# Patient Record
Sex: Male | Born: 1990 | Race: White | Hispanic: No | Marital: Single | Smoking: Never smoker
Health system: Southern US, Community
[De-identification: ages and names within clinical notes are randomized; demographics above are authoritative.]

## PROBLEM LIST (undated history)

## (undated) HISTORY — PX: OTHER SURGICAL HISTORY: SHX169

---
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
## 2008-01-23 ENCOUNTER — Emergency Department: Payer: Self-pay | Admitting: Emergency Medicine

## 2014-01-14 ENCOUNTER — Emergency Department: Payer: Self-pay | Admitting: Emergency Medicine

## 2014-01-22 ENCOUNTER — Emergency Department: Payer: Self-pay | Admitting: Emergency Medicine

## 2016-11-20 ENCOUNTER — Encounter (INDEPENDENT_AMBULATORY_CARE_PROVIDER_SITE_OTHER): Payer: BLUE CROSS/BLUE SHIELD | Admitting: Podiatry

## 2016-11-20 ENCOUNTER — Other Ambulatory Visit: Payer: Self-pay | Admitting: *Deleted

## 2016-11-20 NOTE — Progress Notes (Signed)
This encounter was created in error - please disregard.

## 2019-08-24 ENCOUNTER — Other Ambulatory Visit: Payer: Self-pay

## 2019-08-24 DIAGNOSIS — Z20822 Contact with and (suspected) exposure to covid-19: Secondary | ICD-10-CM

## 2019-08-26 LAB — NOVEL CORONAVIRUS, NAA: SARS-CoV-2, NAA: NOT DETECTED

## 2020-09-30 ENCOUNTER — Emergency Department (HOSPITAL_COMMUNITY)
Admission: EM | Admit: 2020-09-30 | Discharge: 2020-09-30 | Disposition: A | Discharge status: Home / Self Care | Payer: Self-pay | Attending: Emergency Medicine | Admitting: Emergency Medicine

## 2020-09-30 ENCOUNTER — Other Ambulatory Visit: Payer: Self-pay

## 2020-09-30 ENCOUNTER — Encounter (HOSPITAL_COMMUNITY): Payer: Self-pay | Admitting: *Deleted

## 2020-09-30 ENCOUNTER — Emergency Department (HOSPITAL_COMMUNITY): Payer: Self-pay

## 2020-09-30 DIAGNOSIS — S61012A Laceration without foreign body of left thumb without damage to nail, initial encounter: Secondary | ICD-10-CM | POA: Insufficient documentation

## 2020-09-30 DIAGNOSIS — S52601A Unspecified fracture of lower end of right ulna, initial encounter for closed fracture: Secondary | ICD-10-CM

## 2020-09-30 DIAGNOSIS — S52251A Displaced comminuted fracture of shaft of ulna, right arm, initial encounter for closed fracture: Secondary | ICD-10-CM | POA: Insufficient documentation

## 2020-09-30 DIAGNOSIS — S61211A Laceration without foreign body of left index finger without damage to nail, initial encounter: Secondary | ICD-10-CM | POA: Insufficient documentation

## 2020-09-30 DIAGNOSIS — T07XXXA Unspecified multiple injuries, initial encounter: Secondary | ICD-10-CM

## 2020-09-30 DIAGNOSIS — S1191XA Laceration without foreign body of unspecified part of neck, initial encounter: Secondary | ICD-10-CM | POA: Insufficient documentation

## 2020-09-30 DIAGNOSIS — S61215A Laceration without foreign body of left ring finger without damage to nail, initial encounter: Secondary | ICD-10-CM | POA: Insufficient documentation

## 2020-09-30 DIAGNOSIS — S51812A Laceration without foreign body of left forearm, initial encounter: Secondary | ICD-10-CM | POA: Insufficient documentation

## 2020-09-30 LAB — I-STAT CHEM 8, ED
BUN: 16 mg/dL (ref 6–20)
Calcium, Ion: 1.06 mmol/L — ABNORMAL LOW (ref 1.15–1.40)
Chloride: 107 mmol/L (ref 98–111)
Creatinine, Ser: 1.3 mg/dL — ABNORMAL HIGH (ref 0.61–1.24)
Glucose, Bld: 129 mg/dL — ABNORMAL HIGH (ref 70–99)
HCT: 44 % (ref 39.0–52.0)
Hemoglobin: 15 g/dL (ref 13.0–17.0)
Potassium: 3.6 mmol/L (ref 3.5–5.1)
Sodium: 141 mmol/L (ref 135–145)
TCO2: 21 mmol/L — ABNORMAL LOW (ref 22–32)

## 2020-09-30 MED ORDER — SODIUM CHLORIDE 0.9 % IV BOLUS (SEPSIS)
1000.0000 mL | Freq: Once | INTRAVENOUS | Status: AC
  Administered 2020-09-30: 10:00:00 1000 mL via INTRAVENOUS

## 2020-09-30 MED ORDER — LIDOCAINE-EPINEPHRINE 1 %-1:100000 IJ SOLN
20.0000 mL | Freq: Once | INTRAMUSCULAR | Status: AC
  Administered 2020-09-30: 08:00:00 20 mL via INTRADERMAL
  Filled 2020-09-30: qty 1

## 2020-09-30 MED ORDER — SODIUM CHLORIDE 0.9 % IV BOLUS (SEPSIS)
1000.0000 mL | Freq: Once | INTRAVENOUS | Status: AC
  Administered 2020-09-30: 08:00:00 1000 mL via INTRAVENOUS
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
## 2020-09-30 NOTE — ED Notes (Signed)
Family at bedside. 
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
## 2020-09-30 NOTE — ED Notes (Signed)
All wounds cleaned on left arm, hand and left lateral neck. Wound care completed. Ortho to apply splint.

## 2020-09-30 NOTE — Discharge Instructions (Signed)
You may alternate Tylenol 1000 mg every 6 hours as needed for pain, fever and Ibuprofen 800 mg every 8 hours as needed for pain, fever.  Please take Ibuprofen with food.  Do not take more than 4000 mg of Tylenol (acetaminophen) in a 24 hour period.  You may clean your wounds gently with warm soap and water and apply over-the-counter Neosporin 1-2 times a day until healed.  Your sutures will need to be removed in 7 to 10 days.  Please follow-up closely with hand surgery for your right upper extremity fracture.  You need to keep your splint on at all times and keep it clean and dry.  I recommend keeping your arm elevated above the level of your heart when at rest to help with pain and swelling.

## 2020-09-30 NOTE — Progress Notes (Signed)
Orthopedic Tech Progress Note Patient Details:  CHRYSTIAN CUPPLES 01-09-1991 [id number]  Ortho Devices Type of Ortho Device: Sling immobilizer,Sugartong splint Ortho Device/Splint Location: Right Upper Extremity Ortho Device/Splint Interventions: Ordered,Application   Post Interventions Patient Tolerated: Well Instructions Provided: Adjustment of device,Poper ambulation with device,Care of device   Dacotah Cabello P Harle Stanford 09/30/2020, 10:28 AM

## 2020-10-01 ENCOUNTER — Other Ambulatory Visit: Payer: Self-pay

## 2020-10-01 ENCOUNTER — Encounter (HOSPITAL_BASED_OUTPATIENT_CLINIC_OR_DEPARTMENT_OTHER): Payer: Self-pay | Admitting: Orthopedic Surgery

## 2020-10-08 ENCOUNTER — Other Ambulatory Visit (HOSPITAL_COMMUNITY)
Admission: RE | Admit: 2020-10-08 | Discharge: 2020-10-08 | Disposition: A | Discharge status: Home / Self Care | Payer: Self-pay | Source: Ambulatory Visit | Attending: Orthopedic Surgery | Admitting: Orthopedic Surgery

## 2020-10-08 DIAGNOSIS — Z01812 Encounter for preprocedural laboratory examination: Secondary | ICD-10-CM | POA: Insufficient documentation

## 2020-10-08 DIAGNOSIS — Z20822 Contact with and (suspected) exposure to covid-19: Secondary | ICD-10-CM | POA: Insufficient documentation

## 2020-10-08 DIAGNOSIS — U071 COVID-19: Secondary | ICD-10-CM | POA: Insufficient documentation

## 2020-10-08 LAB — SARS CORONAVIRUS 2 (TAT 6-24 HRS): SARS Coronavirus 2: POSITIVE — AB

## 2020-10-08 NOTE — Anesthesia Preprocedure Evaluation (Addendum)
Anesthesia Evaluation  Patient identified by MRN, date of birth, ID band Patient awake    Reviewed: Allergy & Precautions, H&P , NPO status , Patient's Chart, lab work & pertinent test results  Airway Mallampati: II  TM Distance: >3 FB Neck ROM: Full    Dental no notable dental hx. (+) Teeth Intact, Dental Advisory Given   Pulmonary neg pulmonary ROS,    Pulmonary exam normal breath sounds clear to auscultation       Cardiovascular Exercise Tolerance: Good negative cardio ROS   Rhythm:Regular Rate:Normal     Neuro/Psych negative neurological ROS  negative psych ROS   GI/Hepatic negative GI ROS, Neg liver ROS,   Endo/Other  negative endocrine ROS  Renal/GU negative Renal ROS  negative genitourinary   Musculoskeletal   Abdominal   Peds  Hematology negative hematology ROS (+)   Anesthesia Other Findings   Reproductive/Obstetrics negative OB ROS                           Anesthesia Physical Anesthesia Plan  ASA: II  Anesthesia Plan: Regional and MAC   Post-op Pain Management:    Induction: Intravenous  PONV Risk Score and Plan: 2 and Ondansetron, Midazolam and Propofol infusion  Airway Management Planned: Nasal Cannula and Natural Airway  Additional Equipment:   Intra-op Plan:   Post-operative Plan:   Informed Consent: I have reviewed the patients History and Physical, chart, labs and discussed the procedure including the risks, benefits and alternatives for the proposed anesthesia with the patient or authorized representative who has indicated his/her understanding and acceptance.     Dental advisory given  Plan Discussed with: CRNA and Anesthesiologist  Anesthesia Plan Comments: (R supraclavicular block)       Anesthesia Quick Evaluation

## 2020-10-08 NOTE — Progress Notes (Signed)
Pt went for covid test this am and understands to go home and quarantine until arrival here tomorrow at 11:15am for surgery.Instructions reviewed, pt verbalized understanding.

## 2020-10-09 ENCOUNTER — Encounter (HOSPITAL_COMMUNITY)
Admission: RE | Disposition: A | Discharge status: Home / Self Care | Payer: Self-pay | Source: Home / Self Care | Attending: Orthopedic Surgery

## 2020-10-09 ENCOUNTER — Other Ambulatory Visit: Payer: Self-pay

## 2020-10-09 ENCOUNTER — Ambulatory Visit (HOSPITAL_COMMUNITY)
Admission: RE | Admit: 2020-10-09 | Discharge: 2020-10-09 | Disposition: A | Discharge status: Home / Self Care | Payer: Self-pay | Attending: Orthopedic Surgery | Admitting: Orthopedic Surgery

## 2020-10-09 ENCOUNTER — Ambulatory Visit (HOSPITAL_COMMUNITY): Payer: Self-pay

## 2020-10-09 ENCOUNTER — Encounter (HOSPITAL_COMMUNITY): Payer: Self-pay | Admitting: Orthopedic Surgery

## 2020-10-09 ENCOUNTER — Ambulatory Visit (HOSPITAL_COMMUNITY): Payer: Self-pay | Admitting: Anesthesiology

## 2020-10-09 DIAGNOSIS — Y939 Activity, unspecified: Secondary | ICD-10-CM | POA: Insufficient documentation

## 2020-10-09 DIAGNOSIS — S1191XS Laceration without foreign body of unspecified part of neck, sequela: Secondary | ICD-10-CM | POA: Insufficient documentation

## 2020-10-09 DIAGNOSIS — U071 COVID-19: Secondary | ICD-10-CM | POA: Insufficient documentation

## 2020-10-09 DIAGNOSIS — S41112S Laceration without foreign body of left upper arm, sequela: Secondary | ICD-10-CM | POA: Insufficient documentation

## 2020-10-09 DIAGNOSIS — S52601A Unspecified fracture of lower end of right ulna, initial encounter for closed fracture: Secondary | ICD-10-CM | POA: Insufficient documentation

## 2020-10-09 HISTORY — PX: ORIF ULNAR FRACTURE: SHX5417

## 2020-10-09 SURGERY — OPEN REDUCTION INTERNAL FIXATION (ORIF) ULNAR FRACTURE
Anesthesia: Monitor Anesthesia Care | Laterality: Right

## 2020-10-09 MED ORDER — PROPOFOL 1000 MG/100ML IV EMUL
INTRAVENOUS | Status: AC
  Filled 2020-10-09: qty 100

## 2020-10-09 MED ORDER — FENTANYL CITRATE (PF) 100 MCG/2ML IJ SOLN
INTRAMUSCULAR | Status: AC
  Administered 2020-10-09: 13:00:00 100 ug via INTRAVENOUS
  Filled 2020-10-09: qty 2

## 2020-10-09 MED ORDER — LIDOCAINE HCL (CARDIAC) PF 100 MG/5ML IV SOSY
PREFILLED_SYRINGE | INTRAVENOUS | Status: DC | PRN
  Administered 2020-10-09: 25 mg via INTRAVENOUS

## 2020-10-09 MED ORDER — ACETAMINOPHEN 500 MG PO TABS
1000.0000 mg | ORAL_TABLET | Freq: Once | ORAL | Status: AC
  Administered 2020-10-09: 12:00:00 1000 mg via ORAL
  Filled 2020-10-09: qty 2

## 2020-10-09 MED ORDER — FENTANYL CITRATE (PF) 100 MCG/2ML IJ SOLN
INTRAMUSCULAR | Status: DC | PRN
  Administered 2020-10-09: 50 ug via INTRAVENOUS

## 2020-10-09 MED ORDER — LACTATED RINGERS IV SOLN: INTRAVENOUS | Status: DC

## 2020-10-09 MED ORDER — PROPOFOL 500 MG/50ML IV EMUL
INTRAVENOUS | Status: DC | PRN
  Administered 2020-10-09: 75 ug/kg/min via INTRAVENOUS

## 2020-10-09 MED ORDER — MIDAZOLAM HCL 2 MG/2ML IJ SOLN
INTRAMUSCULAR | Status: AC
  Administered 2020-10-09: 13:00:00 2 mg via INTRAVENOUS
  Filled 2020-10-09: qty 2

## 2020-10-09 MED ORDER — 0.9 % SODIUM CHLORIDE (POUR BTL) OPTIME
TOPICAL | Status: DC | PRN
Start: 2020-10-09 — End: 2020-10-09
  Administered 2020-10-09: 16:00:00 1000 mL

## 2020-10-09 MED ORDER — CHLORHEXIDINE GLUCONATE 0.12 % MT SOLN
15.0000 mL | Freq: Once | OROMUCOSAL | Status: AC
  Administered 2020-10-09: 12:00:00 15 mL via OROMUCOSAL
  Filled 2020-10-09: qty 15

## 2020-10-09 MED ORDER — HYDROCODONE-ACETAMINOPHEN 5-325 MG PO TABS: 1.0000 | ORAL_TABLET | Freq: Four times a day (QID) | ORAL | 0 refills | Status: AC | PRN

## 2020-10-09 MED ORDER — MIDAZOLAM HCL 2 MG/2ML IJ SOLN
INTRAMUSCULAR | Status: DC | PRN
  Administered 2020-10-09: 2 mg via INTRAVENOUS

## 2020-10-09 MED ORDER — FENTANYL CITRATE (PF) 250 MCG/5ML IJ SOLN
INTRAMUSCULAR | Status: AC
  Filled 2020-10-09: qty 5

## 2020-10-09 MED ORDER — HYDROMORPHONE HCL 1 MG/ML IJ SOLN: 0.2500 mg | INTRAMUSCULAR | Status: DC | PRN

## 2020-10-09 MED ORDER — DEXMEDETOMIDINE HCL 200 MCG/2ML IV SOLN
INTRAVENOUS | Status: DC | PRN
  Administered 2020-10-09: 4 ug via INTRAVENOUS
  Administered 2020-10-09: 8 ug via INTRAVENOUS

## 2020-10-09 MED ORDER — CEFAZOLIN SODIUM-DEXTROSE 2-4 GM/100ML-% IV SOLN
2.0000 g | INTRAVENOUS | Status: AC
  Administered 2020-10-09: 15:00:00 2 g via INTRAVENOUS
  Filled 2020-10-09: qty 100

## 2020-10-09 MED ORDER — FENTANYL CITRATE (PF) 100 MCG/2ML IJ SOLN: 100.0000 ug | Freq: Once | INTRAMUSCULAR | Status: AC

## 2020-10-09 MED ORDER — ORAL CARE MOUTH RINSE: 15.0000 mL | Freq: Once | OROMUCOSAL | Status: AC

## 2020-10-09 MED ORDER — MIDAZOLAM HCL 2 MG/2ML IJ SOLN: 2.0000 mg | Freq: Once | INTRAMUSCULAR | Status: AC

## 2020-10-09 MED ORDER — DIPHENHYDRAMINE HCL 50 MG/ML IJ SOLN
INTRAMUSCULAR | Status: DC | PRN
  Administered 2020-10-09: 12.5 mg via INTRAVENOUS

## 2020-10-09 MED ORDER — MIDAZOLAM HCL 2 MG/2ML IJ SOLN
INTRAMUSCULAR | Status: AC
  Filled 2020-10-09: qty 2

## 2020-10-09 MED ORDER — BUPIVACAINE-EPINEPHRINE (PF) 0.5% -1:200000 IJ SOLN
INTRAMUSCULAR | Status: DC | PRN
  Administered 2020-10-09: 30 mL via PERINEURAL

## 2020-10-09 SURGICAL SUPPLY — 67 items
BENZOIN TINCTURE PRP APPL 2/3 (GAUZE/BANDAGES/DRESSINGS) ×2 IMPLANT
BIT DRILL 2.8X5 QR DISP (BIT) ×2 IMPLANT
BLADE CLIPPER SURG (BLADE) IMPLANT
BNDG ELASTIC 3X5.8 VLCR STR LF (GAUZE/BANDAGES/DRESSINGS) ×2 IMPLANT
BNDG ELASTIC 4X5.8 VLCR STR LF (GAUZE/BANDAGES/DRESSINGS) ×4 IMPLANT
BNDG ESMARK 4X9 LF (GAUZE/BANDAGES/DRESSINGS) ×2 IMPLANT
CLOSURE STERI-STRIP 1/4X4 (GAUZE/BANDAGES/DRESSINGS) ×2 IMPLANT
CLSR STERI-STRIP ANTIMIC 1/2X4 (GAUZE/BANDAGES/DRESSINGS) ×2 IMPLANT
CORD BIPOLAR FORCEPS 12FT (ELECTRODE) ×2 IMPLANT
COVER SURGICAL LIGHT HANDLE (MISCELLANEOUS) ×4 IMPLANT
COVER WAND RF STERILE (DRAPES) ×2 IMPLANT
CUFF TOURN SGL QUICK 18X4 (TOURNIQUET CUFF) ×2 IMPLANT
CUFF TOURN SGL QUICK 24 (TOURNIQUET CUFF)
CUFF TRNQT CYL 24X4X16.5-23 (TOURNIQUET CUFF) IMPLANT
DECANTER SPIKE VIAL GLASS SM (MISCELLANEOUS) IMPLANT
DRAPE INCISE IOBAN 66X45 STRL (DRAPES) IMPLANT
DRAPE OEC MINIVIEW 54X84 (DRAPES) IMPLANT
DRAPE U-SHAPE 47X51 STRL (DRAPES) ×2 IMPLANT
DRSG PAD ABDOMINAL 8X10 ST (GAUZE/BANDAGES/DRESSINGS) ×2 IMPLANT
ELECT REM PT RETURN 9FT ADLT (ELECTROSURGICAL)
ELECTRODE REM PT RTRN 9FT ADLT (ELECTROSURGICAL) IMPLANT
GAUZE SPONGE 4X4 12PLY STRL (GAUZE/BANDAGES/DRESSINGS) ×2 IMPLANT
GAUZE SPONGE 4X4 12PLY STRL LF (GAUZE/BANDAGES/DRESSINGS) ×2 IMPLANT
GAUZE XEROFORM 1X8 LF (GAUZE/BANDAGES/DRESSINGS) ×2 IMPLANT
GLOVE BIO SURGEON STRL SZ7 (GLOVE) ×2 IMPLANT
GLOVE BIOGEL PI IND STRL 7.0 (GLOVE) ×1 IMPLANT
GLOVE BIOGEL PI INDICATOR 7.0 (GLOVE) ×1
GLOVE ORTHO TXT STRL SZ7.5 (GLOVE) ×2 IMPLANT
GLOVE SS BIOGEL STRL SZ 8 (GLOVE) ×1 IMPLANT
GLOVE SUPERSENSE BIOGEL SZ 8 (GLOVE) ×1
GLOVE SURG ORTHO 8.0 STRL STRW (GLOVE) ×4 IMPLANT
GOWN STRL REUS W/ TWL LRG LVL3 (GOWN DISPOSABLE) IMPLANT
GOWN STRL REUS W/TWL LRG LVL3 (GOWN DISPOSABLE)
GUIDEWIRE ORTHO .059X5 (WIRE) ×2 IMPLANT
KIT BASIN OR (CUSTOM PROCEDURE TRAY) ×2 IMPLANT
KIT TURNOVER KIT B (KITS) ×2 IMPLANT
LOOP VESSEL MAXI BLUE (MISCELLANEOUS) ×2 IMPLANT
MANIFOLD NEPTUNE II (INSTRUMENTS) ×2 IMPLANT
NEEDLE 22X1 1/2 (OR ONLY) (NEEDLE) IMPLANT
NEEDLE BLUNT 16X1.5 OR ONLY (NEEDLE) IMPLANT
NS IRRIG 1000ML POUR BTL (IV SOLUTION) ×4 IMPLANT
PACK ORTHO EXTREMITY (CUSTOM PROCEDURE TRAY) ×2 IMPLANT
PAD ABD 8X10 STRL (GAUZE/BANDAGES/DRESSINGS) ×2 IMPLANT
PAD ARMBOARD 7.5X6 YLW CONV (MISCELLANEOUS) ×4 IMPLANT
PAD CAST 4YDX4 CTTN HI CHSV (CAST SUPPLIES) ×1 IMPLANT
PADDING CAST COTTON 4X4 STRL (CAST SUPPLIES) ×1
PLATE ULNA MIDSHAFT 8 HOLE (Plate) ×2 IMPLANT
SCREW CORTICAL 3.5X20MM (Screw) ×4 IMPLANT
SCREW HEXALOBE NON-LOCK 3.5X14 (Screw) ×6 IMPLANT
SCREW HEXALOBE NON-LOCK 3.5X16 (Screw) ×2 IMPLANT
SCREW NON LOCKING HEX 3.5X18MM (Screw) ×2 IMPLANT
SLING ARM FOAM STRAP LRG (SOFTGOODS) ×2 IMPLANT
SPONGE LAP 4X18 RFD (DISPOSABLE) ×2 IMPLANT
STAPLER VISISTAT 35W (STAPLE) IMPLANT
SUCTION FRAZIER HANDLE 10FR (MISCELLANEOUS) ×1
SUCTION TUBE FRAZIER 10FR DISP (MISCELLANEOUS) ×1 IMPLANT
SUT ETHILON 4 0 PS 2 18 (SUTURE) IMPLANT
SUT PROLENE 4 0 PS 2 18 (SUTURE) IMPLANT
SUT VIC AB 2-0 CT1 27 (SUTURE) ×1
SUT VIC AB 2-0 CT1 TAPERPNT 27 (SUTURE) ×1 IMPLANT
SUT VIC AB 3-0 FS2 27 (SUTURE) ×2 IMPLANT
SYR CONTROL 10ML LL (SYRINGE) IMPLANT
TOWEL GREEN STERILE (TOWEL DISPOSABLE) ×2 IMPLANT
TOWEL GREEN STERILE FF (TOWEL DISPOSABLE) ×2 IMPLANT
TUBE CONNECTING 12X1/4 (SUCTIONS) ×2 IMPLANT
WATER STERILE IRR 1000ML POUR (IV SOLUTION) ×4 IMPLANT
YANKAUER SUCT BULB TIP NO VENT (SUCTIONS) IMPLANT

## 2020-10-09 NOTE — Progress Notes (Signed)
Patient with positive covid result. Contacted MD and informed of result.   

## 2020-10-09 NOTE — Anesthesia Postprocedure Evaluation (Signed)
Anesthesia Post Note  Patient: Eric Stanley  Procedure(s) Performed: OPEN REDUCTION INTERNAL FIXATION (ORIF) ULNAR FRACTURE (Right )     Patient location during evaluation: PACU Anesthesia Type: Regional and MAC Level of consciousness: awake and alert Pain management: pain level controlled Vital Signs Assessment: post-procedure vital signs reviewed and stable Respiratory status: spontaneous breathing, nonlabored ventilation and respiratory function stable Cardiovascular status: stable and blood pressure returned to baseline Postop Assessment: no apparent nausea or vomiting Anesthetic complications: no   No complications documented.  Last Vitals:  Vitals:   10/09/20 1700 10/09/20 1710  BP: 128/67 (!) 145/78  Pulse: (!) 57 (!) 56  Resp: 14   Temp: (!) 36.1 C   SpO2: 97% 98%    Last Pain:  Vitals:   10/09/20 1700  TempSrc:   PainSc: 0-No pain                 Phillippa Straub,W. EDMOND

## 2020-10-09 NOTE — OR Nursing (Signed)
SUTURES REMOVED FROM MULTIPLE SMALL HEALED WOUNDS ON LEFT FOREARM AND NECK AT END OF PROCEDURE BY PA.

## 2020-10-09 NOTE — Discharge Instructions (Signed)
Diet: As you were doing prior to hospitalization   Shower:  May shower but keep the wounds dry, use an occlusive plastic wrap, NO SOAKING IN TUB.  If the bandage gets wet, change with a clean dry gauze.  If you have a splint on, leave the splint in place and keep the splint dry with a plastic bag.  Dressing:  You may change your dressing 3-5 days after surgery, unless you have a splint.  If you have a splint, then just leave the splint in place and we will change your bandages during your first follow-up appointment.    If you had hand or foot surgery, we will plan to remove your stitches in about 2 weeks in the office.  For all other surgeries, there are sticky tapes (steri-strips) on your wounds and all the stitches are absorbable.  Leave the steri-strips in place when changing your dressings, they will peel off with time, usually 2-3 weeks.  Activity:  Increase activity slowly as tolerated, but follow the weight bearing instructions below.  The rules on driving is that you can not be taking narcotics while you drive, and you must feel in control of the vehicle.    Weight Bearing:   No weight bearing with right arm  To prevent constipation: you may use a stool softener such as -  Colace (over the counter) 100 mg by mouth twice a day  Drink plenty of fluids (prune juice may be helpful) and high fiber foods Miralax (over the counter) for constipation as needed.    Itching:  If you experience itching with your medications, try taking only a single pain pill, or even half a pain pill at a time.  You may take up to 10 pain pills per day, and you can also use benadryl over the counter for itching or also to help with sleep.   Precautions:  If you experience chest pain or shortness of breath - call 911 immediately for transfer to the hospital emergency department!!  If you develop a fever greater that 101 F, purulent drainage from wound, increased redness or drainage from wound, or calf pain -- Call  the office at [phone]                                                Follow- Up Appointment:  Please call for an appointment to be seen in 2 weeks [place] - [phone]

## 2020-10-09 NOTE — Anesthesia Procedure Notes (Signed)
Anesthesia Regional Block: Supraclavicular block   Pre-Anesthetic Checklist: ,, timeout performed, Correct Patient, Correct Site, Correct Laterality, Correct Procedure, Correct Position, site marked, Risks and benefits discussed, pre-op evaluation,  At surgeon's request and post-op pain management  Laterality: Right  Prep: Maximum Sterile Barrier Precautions used, chloraprep       Needles:  Injection technique: Single-shot  Needle Type: Echogenic Stimulator Needle     Needle Length: 9cm  Needle Gauge: 21     Additional Needles:   Procedures:,,,, ultrasound used (permanent image in chart),,,,  Narrative:  Start time: 10/09/2020 1:22 PM End time: 10/09/2020 1:32 PM Injection made incrementally with aspirations every 5 mL. Anesthesiologist: Gaynelle Adu, MD  Additional Notes: 2% Lidocaine skin wheel.

## 2020-10-09 NOTE — OR Nursing (Signed)
PT. RECOVERED BY PACU RN IN OR. D/C DIRECTLY FROM OR PER T. ISAACS RN.

## 2020-10-09 NOTE — Anesthesia Procedure Notes (Signed)
Procedure Name: MAC Date/Time: 10/09/2020 2:53 PM Performed by: Mariea Clonts, CRNA Pre-anesthesia Checklist: Patient identified, Emergency Drugs available, Suction available, Patient being monitored and Timeout performed Patient Re-evaluated:Patient Re-evaluated prior to induction Oxygen Delivery Method: Simple face mask

## 2020-10-09 NOTE — Transfer of Care (Signed)
Immediate Anesthesia Transfer of Care Note  Patient: STYLES FAMBRO  Procedure(s) Performed: OPEN REDUCTION INTERNAL FIXATION (ORIF) ULNAR FRACTURE (Right )  Patient Location: PACU RN recovering pt in OR d/t COVID + status  Anesthesia Type:MAC and Regional  Level of Consciousness: awake and alert   Airway & Oxygen Therapy: Patient Spontanous Breathing and Patient connected to face mask oxygen  Post-op Assessment: Report given to RN and Post -op Vital signs reviewed and stable  Post vital signs: Reviewed and stable  Last Vitals:  Vitals Value Taken Time  BP 121/65 10/09/20 16:51  Temp    Pulse 49 10/09/20 16:51  Resp 16 10/09/20 16:51  SpO2 98 10/09/20 16:51    Last Pain:  Vitals:   10/09/20 1355  TempSrc:   PainSc: 0-No pain      Patients Stated Pain Goal: 3 (07/62/26 3335)  Complications: No complications documented.

## 2020-10-09 NOTE — Op Note (Signed)
10/09/2020  4:05 PM  PATIENT:  Eric Stanley    PRE-OPERATIVE DIAGNOSIS:  RIGHT ULNA FRACTURE with multiple lacerations, left upper extremity, as well as neck  POST-OPERATIVE DIAGNOSIS:  Same  PROCEDURE:  OPEN REDUCTION INTERNAL FIXATION (ORIF) ULNAR FRACTURE Removal of sutures, left upper extremity, and neck 2 views right forearm taken postoperatively demonstrating anatomic alignment status post open reduction internal fixation with some residual comminution at the fracture site  SURGEON:  [name], MD  PHYSICIAN ASSISTANT: Janine Ores, PA-C, present and scrubbed throughout the case, critical for completion in a timely fashion, and for retraction, instrumentation, and closure.  ANESTHESIA:   Regional  PREOPERATIVE INDICATIONS:  Eric Stanley is a  29 y.o. male with a diagnosis of RIGHT ULNA FRACTURE who failed conservative measures and elected for surgical management.  He had significant displacement, as well as multiple lacerations that were repaired in the emergency room.  The risks benefits and alternatives were discussed with the patient including but not limited to the risks of nonoperative treatment, versus surgical intervention including infection, bleeding, nerve injury, malunion, nonunion, the need for revision surgery, hardware prominence, hardware failure, the need for hardware removal, blood clots, cardiopulmonary complications, morbidity, mortality, among others, and they were willing to proceed.     ESTIMATED BLOOD LOSS: Minimal  OPERATIVE IMPLANTS: Acumed titanium plate with 4 distal screws and 3 proximal screws, all nonlocking.  OPERATIVE FINDINGS: Comminuted displaced ulna fracture with plastic deformation at the fracture site  OPERATIVE PROCEDURE: The patient was brought to the operating room and placed in supine position.  Regional anesthesia was administered.  Covid precautions were taken.  The right upper extremity was prepped and draped in usual  sterile fashion.  Timeout was performed.  The arm was elevated and exsanguinated and a tourniquet was inflated to 250 mmHg.  Incision was made along the ulnar border and the fracture site exposed.  Care was taken to protect the volar arterial structures as well as the dorsal branch of the ulnar nerve which was not encountered, the fracture was proximal on the left to avoid this.  The fracture site was exposed, and it was too transverse and too comminuted to accommodate a lag screw.  I applied a plate, and I selected the 8 hole plate because the 6-hole plate did not look like it was going to have enough to allow 3 on either side.  I secured the plate proximally with a nonlocking screw, and then applied a clamp across the fracture site securing the distal segment to the plate bone construct.  I used a K wire to confirm position, used C-arm guidance, and then finalized the fixation of the plate.  This was not amenable to compression type technique because of comminution.  There was a small piece of bone also that came out of the wound, which I believe was on the radial side deep within the wound.  I had excellent bony apposition despite comminution, there was 1 area on the volar aspect that had some plastic deformation, which I ignored.  I secured the plate distally with 4 screws, proximally with 3 screws, and had excellent bicortical purchase and bone quality was excellent.  The wounds were irrigated copiously, the subcutaneous tissue and fascia closed with Vicryl followed by routine closure for the skin.   The sutures on his other extremity and neck were removed.  He tolerated the procedure well and there were no complications.
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
## 2020-10-09 NOTE — Interval H&P Note (Signed)
History and Physical Interval Note:  10/09/2020 2:26 PM  Eric Stanley  has presented today for surgery, with the diagnosis of RIGHT ULNA FRACTURE.  The various methods of treatment have been discussed with the patient and family. After consideration of risks, benefits and other options for treatment, the patient has consented to  Procedure(s): OPEN REDUCTION INTERNAL FIXATION (ORIF) ULNAR FRACTURE (Right) as a surgical intervention.  The patient's history has been reviewed, patient examined, no change in status, stable for surgery.  I have reviewed the patient's chart and labs.  Questions were answered to the patient's satisfaction.    Patient also tested positive for COVID.  Precautions taken.    [name]

## 2020-10-10 ENCOUNTER — Encounter (HOSPITAL_COMMUNITY): Payer: Self-pay | Admitting: Orthopedic Surgery

## 2020-10-15 ENCOUNTER — Encounter (HOSPITAL_COMMUNITY): Payer: Self-pay | Admitting: Orthopedic Surgery

## 2021-05-26 ENCOUNTER — Emergency Department

## 2021-05-26 ENCOUNTER — Emergency Department
Admission: EM | Admit: 2021-05-26 | Discharge: 2021-05-26 | Disposition: A | Discharge status: Home / Self Care | Attending: Emergency Medicine | Admitting: Emergency Medicine

## 2021-05-26 ENCOUNTER — Other Ambulatory Visit: Payer: Self-pay

## 2021-05-26 DIAGNOSIS — S0001XA Abrasion of scalp, initial encounter: Secondary | ICD-10-CM

## 2021-05-26 DIAGNOSIS — T148XXA Other injury of unspecified body region, initial encounter: Secondary | ICD-10-CM

## 2021-05-26 DIAGNOSIS — S0101XA Laceration without foreign body of scalp, initial encounter: Secondary | ICD-10-CM | POA: Diagnosis not present

## 2021-05-26 DIAGNOSIS — S0990XA Unspecified injury of head, initial encounter: Secondary | ICD-10-CM | POA: Diagnosis present

## 2021-05-26 DIAGNOSIS — Y92810 Car as the place of occurrence of the external cause: Secondary | ICD-10-CM | POA: Diagnosis not present

## 2021-05-26 NOTE — Discharge Instructions (Signed)
Keep your scalp dry and clean.  Okay to wash with warm water and shampoo like you normally do.  Watch for any signs of infection which include redness of the skin surrounding the area, pus, or fever.  If those develop please return to the emergency room.
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
## 2021-05-26 NOTE — ED Notes (Signed)
Offered to clean pt finger wound and head abrasion. Pt refused.  md ordered head ct scan and pt refused. Pt refused dc v/s and signature.
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
